# Patient Record
Sex: Female | Born: 1935 | Race: White | Hispanic: No
Health system: Southern US, Community
[De-identification: ages and names within clinical notes are randomized; demographics above are authoritative.]

## PROBLEM LIST (undated history)

## (undated) DIAGNOSIS — I471 Supraventricular tachycardia: Secondary | ICD-10-CM

---

## 2016-07-08 ENCOUNTER — Emergency Department (HOSPITAL_COMMUNITY): Payer: Medicare Other

## 2016-07-08 ENCOUNTER — Emergency Department (HOSPITAL_COMMUNITY)
Admission: EM | Admit: 2016-07-08 | Discharge: 2016-07-08 | Disposition: A | Discharge status: Home / Self Care | Payer: Medicare Other | Attending: Emergency Medicine | Admitting: Emergency Medicine

## 2016-07-08 ENCOUNTER — Encounter (HOSPITAL_COMMUNITY): Payer: Self-pay | Admitting: *Deleted

## 2016-07-08 DIAGNOSIS — R000 Tachycardia, unspecified: Secondary | ICD-10-CM | POA: Diagnosis present

## 2016-07-08 DIAGNOSIS — I471 Supraventricular tachycardia: Secondary | ICD-10-CM | POA: Diagnosis not present

## 2016-07-08 HISTORY — DX: Supraventricular tachycardia: I47.1

## 2016-07-08 LAB — CBC
HCT: 44.7 % (ref 36.0–46.0)
Hemoglobin: 15.2 g/dL — ABNORMAL HIGH (ref 12.0–15.0)
MCH: 30.1 pg (ref 26.0–34.0)
MCHC: 34 g/dL (ref 30.0–36.0)
MCV: 88.5 fL (ref 78.0–100.0)
PLATELETS: 309 10*3/uL (ref 150–400)
RBC: 5.05 MIL/uL (ref 3.87–5.11)
RDW: 14.5 % (ref 11.5–15.5)
WBC: 10.9 10*3/uL — ABNORMAL HIGH (ref 4.0–10.5)

## 2016-07-08 LAB — BASIC METABOLIC PANEL
Anion gap: 13 (ref 5–15)
BUN: 16 mg/dL (ref 6–20)
CHLORIDE: 101 mmol/L (ref 101–111)
CO2: 23 mmol/L (ref 22–32)
CREATININE: 0.79 mg/dL (ref 0.44–1.00)
Calcium: 9.5 mg/dL (ref 8.9–10.3)
GFR calc non Af Amer: >60 mL/min (ref >60)
Glucose, Bld: 209 mg/dL — ABNORMAL HIGH (ref 65–99)
Potassium: 4.1 mmol/L (ref 3.5–5.1)
Sodium: 137 mmol/L (ref 135–145)

## 2016-07-08 LAB — URINALYSIS, ROUTINE W REFLEX MICROSCOPIC
BILIRUBIN URINE: NEGATIVE
Glucose, UA: NEGATIVE mg/dL
Hgb urine dipstick: NEGATIVE
Ketones, ur: 15 mg/dL — AB
Leukocytes, UA: NEGATIVE
NITRITE: NEGATIVE
PH: 6 (ref 5.0–8.0)
Protein, ur: NEGATIVE mg/dL
SPECIFIC GRAVITY, URINE: 1.004 — AB (ref 1.005–1.030)

## 2016-07-08 LAB — I-STAT TROPONIN, ED: Troponin i, poc: 0.01 ng/mL (ref 0.00–0.08)

## 2016-07-08 MED ORDER — MORPHINE SULFATE (PF) 4 MG/ML IV SOLN
4.0000 mg | Freq: Once | INTRAVENOUS | Status: AC
  Administered 2016-07-08: 4 mg via INTRAVENOUS
  Filled 2016-07-08: qty 1

## 2016-07-08 MED ORDER — ADENOSINE 6 MG/2ML IV SOLN
INTRAVENOUS | Status: AC
  Filled 2016-07-08: qty 4

## 2016-07-08 MED ORDER — ONDANSETRON 4 MG PO TBDP: 4.0000 mg | ORAL_TABLET | Freq: Once | ORAL | Status: DC

## 2016-07-08 MED ORDER — SODIUM CHLORIDE 0.9 % IV BOLUS (SEPSIS)
500.0000 mL | Freq: Once | INTRAVENOUS | Status: AC
  Administered 2016-07-08: 500 mL via INTRAVENOUS

## 2016-07-08 MED ORDER — METOPROLOL TARTRATE 5 MG/5ML IV SOLN: 5.0000 mg | Freq: Once | INTRAVENOUS | Status: DC

## 2016-07-08 NOTE — Discharge Instructions (Signed)
Return to the ED with any concerns including recurrent SVT,, chest pain, fainting, difficulty breathing, decreased level of alertness/lethargy, or any other alarming symptoms

## 2016-07-08 NOTE — ED Notes (Signed)
Pt was instructed to blow in a syringe. Pt did this twice and hr has now decreased

## 2016-07-08 NOTE — ED Triage Notes (Signed)
Pt states that she began feeling her heart race at approx 11am today. Pt states that she has hx of same and it occurred yesterday as well. Pt states that in the past she has used 6mg  of adenosine. Pt alert and oriented.

## 2016-07-08 NOTE — ED Provider Notes (Signed)
[facility]-EMERGENCY DEPT Provider Note   CSN: [id number] Arrival date & time: 07/08/16  1157     History   Chief Complaint No chief complaint on file.   HPI Dorothy Espinoza is a 80 y.o. female.  HPI  Pt brought back emergently from triage and seen immediately upon placement in room.  Pt presenting with rapid heart rate.  She has a hx of SVT- takes toprol and has not had an episode in 4 years.  [place] from [place]- had episode of SVT yesterday and almost came to the ED but it resolved on its own.  Today she has felt rapid heart rate for the past 1.5 hours.  No chest pain, no difficulty breathing.  No fever/chills.  She states she has not been drinking as much water as usual.  She states that usually she bends over at the waist and that will resolve her SVT- however she has been having muscular back and side pain due to traveling and lifting heavy packages/luggage- so she was not able to bend over.  There are no other associated systemic symptoms, there are no other alleviating or modifying factors.   She took a long acting dose of toprol at 11am.  No dizziness or syncope.    Past Medical History:  Diagnosis Date  . SVT (supraventricular tachycardia) (HCC)     There are no active problems to display for this patient.   History reviewed. No pertinent surgical history.  OB History    No data available       Home Medications    Prior to Admission medications   Not on File    Family History No family history on file.  Social History Social History  Substance Use Topics  . Smoking status: Not on file  . Smokeless tobacco: Not on file  . Alcohol use Not on file     Allergies   Patient has no allergy information on record.   Review of Systems Review of Systems  ROS reviewed and all otherwise negative except for mentioned in HPI   Physical Exam Updated Vital Signs BP 136/74   Pulse 74   Temp 98 F (36.7 C) (Oral)   Resp 19   Wt 58.5 kg   SpO2 99%    Vitals reviewed Physical Exam Physical Examination: General appearance - alert, well appearing, and in no distress, concerned appearing Mental status - alert, oriented to person, place, and time Eyes - no conjunctival injection, no scleral icterus Chest - clear to auscultation, no wheezes, rales or rhonchi, symmetric air entry Heart - normal rate, regular rhythm, normal S1, S2, no murmurs, rubs, clicks or gallops Abdomen - soft, nontender, nondistended, no masses or organomegaly, nontender Back exam - no midline tenderness, diffuse tenderness top palpation bilaterally in lower back Neurological - alert, oriented, normal speech Extremities - peripheral pulses normal, no pedal edema, no clubbing or cyanosis Skin - normal coloration and turgor, no rashes  ED Treatments / Results  Labs (all labs ordered are listed, but only abnormal results are displayed) Labs Reviewed  BASIC METABOLIC PANEL - Abnormal; Notable for the following:       Result Value   Glucose, Bld 209 (*)    All other components within normal limits  CBC - Abnormal; Notable for the following:    WBC 10.9 (*)    Hemoglobin 15.2 (*)    All other components within normal limits  URINALYSIS, ROUTINE W REFLEX MICROSCOPIC (NOT AT [facility]) - Abnormal;  Notable for the following:    Specific Gravity, Urine 1.004 (*)    Ketones, ur 15 (*)    All other components within normal limits  I-STAT TROPOININ, ED    EKG  EKG Interpretation  Date/Time:  Friday July 08 2016 12:01:41 EST Ventricular Rate:  194 PR Interval:    QRS Duration: 86 QT Interval:  232 QTC Calculation: 416 R Axis:   83 Text Interpretation:  Supraventricular tachycardia Marked ST abnormality, possible inferolateral subendocardial injury Abnormal ECG No old tracing to compare Confirmed by [facility]  MD, MARTHA [id number]) on 07/08/2016 12:55:23 PM       Radiology Dg Chest 2 View  Result Date: 07/08/2016 CLINICAL DATA:  Tachycardia.  Chest pain EXAM: CHEST  2  VIEW COMPARISON:  None. FINDINGS: There is a minimal right pleural effusion. There is no edema or consolidation. Heart is borderline prominent with pulmonary vascularity within normal limits. No adenopathy. Bones are osteoporotic. There is anterior wedging of a mid thoracic vertebral body. IMPRESSION: Minimal right pleural effusion. No edema or consolidation. Heart borderline prominent. Bones osteoporotic with anterior wedging of a mid thoracic vertebral body, age uncertain. Electronically Signed   By: Bretta BangWilliam  Woodruff III M.D.   On: 07/08/2016 13:58    Procedures Procedures (including critical care time)  Medications Ordered in ED Medications  adenosine (ADENOCARD) 6 MG/2ML injection (not administered)  ondansetron (ZOFRAN-ODT) disintegrating tablet 4 mg (0 mg Oral Hold 07/08/16 1243)  sodium chloride 0.9 % bolus 500 mL (0 mLs Intravenous Stopped 07/08/16 1243)  morphine 4 MG/ML injection 4 mg (4 mg Intravenous Given 07/08/16 1237)     Initial Impression / Assessment and Plan / ED Course  I have reviewed the triage vital signs and the nursing notes.  Pertinent labs & imaging results that were available during my care of the patient were reviewed by me and considered in my medical decision making (see chart for details).  Clinical Course   CRITICAL CARE Performed by: Ethelda ChickLINKER,MARTHA K Total critical care time: 40 minutes Critical care time was exclusive of separately billable procedures and treating other patients. Critical care was necessary to treat or prevent imminent or life-threatening deterioration. Critical care was time spent personally by me on the following activities: development of treatment plan with patient and/or surrogate as well as nursing, discussions with consultants, evaluation of patient's response to treatment, examination of patient, obtaining history from patient or surrogate, ordering and performing treatments and interventions, ordering and review of laboratory  studies, ordering and review of radiographic studies, pulse oximetry and re-evaluation of patient's condition.  Pt initially came out of SVT after valsalva maneuver, blowing into a syringe.  Then SVT recurred to 170s.  Just as about to administer adenosine, HR decreased to 100s back in sinus rhythm.  Pt appears dehydrated and is in significant pain in sides and back- will give NS bolus and morphine to see if this will help to keep her out of SVT.  Awaiting electrolytes- pt states she has a hx of hypokalemia.    Electrolytes and other labs reassuring.  Pt has been monitored in the ED several hours and HR is in 80s/90s- remains in sinus rhythm.  Discharged with strict return precautions.  Pt agreeable with plan.  Final Clinical Impressions(s) / ED Diagnoses   Final diagnoses:  SVT (supraventricular tachycardia) (HCC)    New Prescriptions New Prescriptions   No medications on file     Jerelyn ScottMartha Linker, MD 07/08/16 1546

## 2016-07-08 NOTE — ED Notes (Signed)
Pt HR increased to 180 again. Dr. Karma GanjaLinker made aware.

## 2018-07-17 IMAGING — CR DG CHEST 2V
2 series · 2 of 2 positions shown · non-contrast
Comparison: None.

CLINICAL DATA: Tachycardia.  Chest pain

EXAM:
CHEST  2 VIEW

[chest lat]
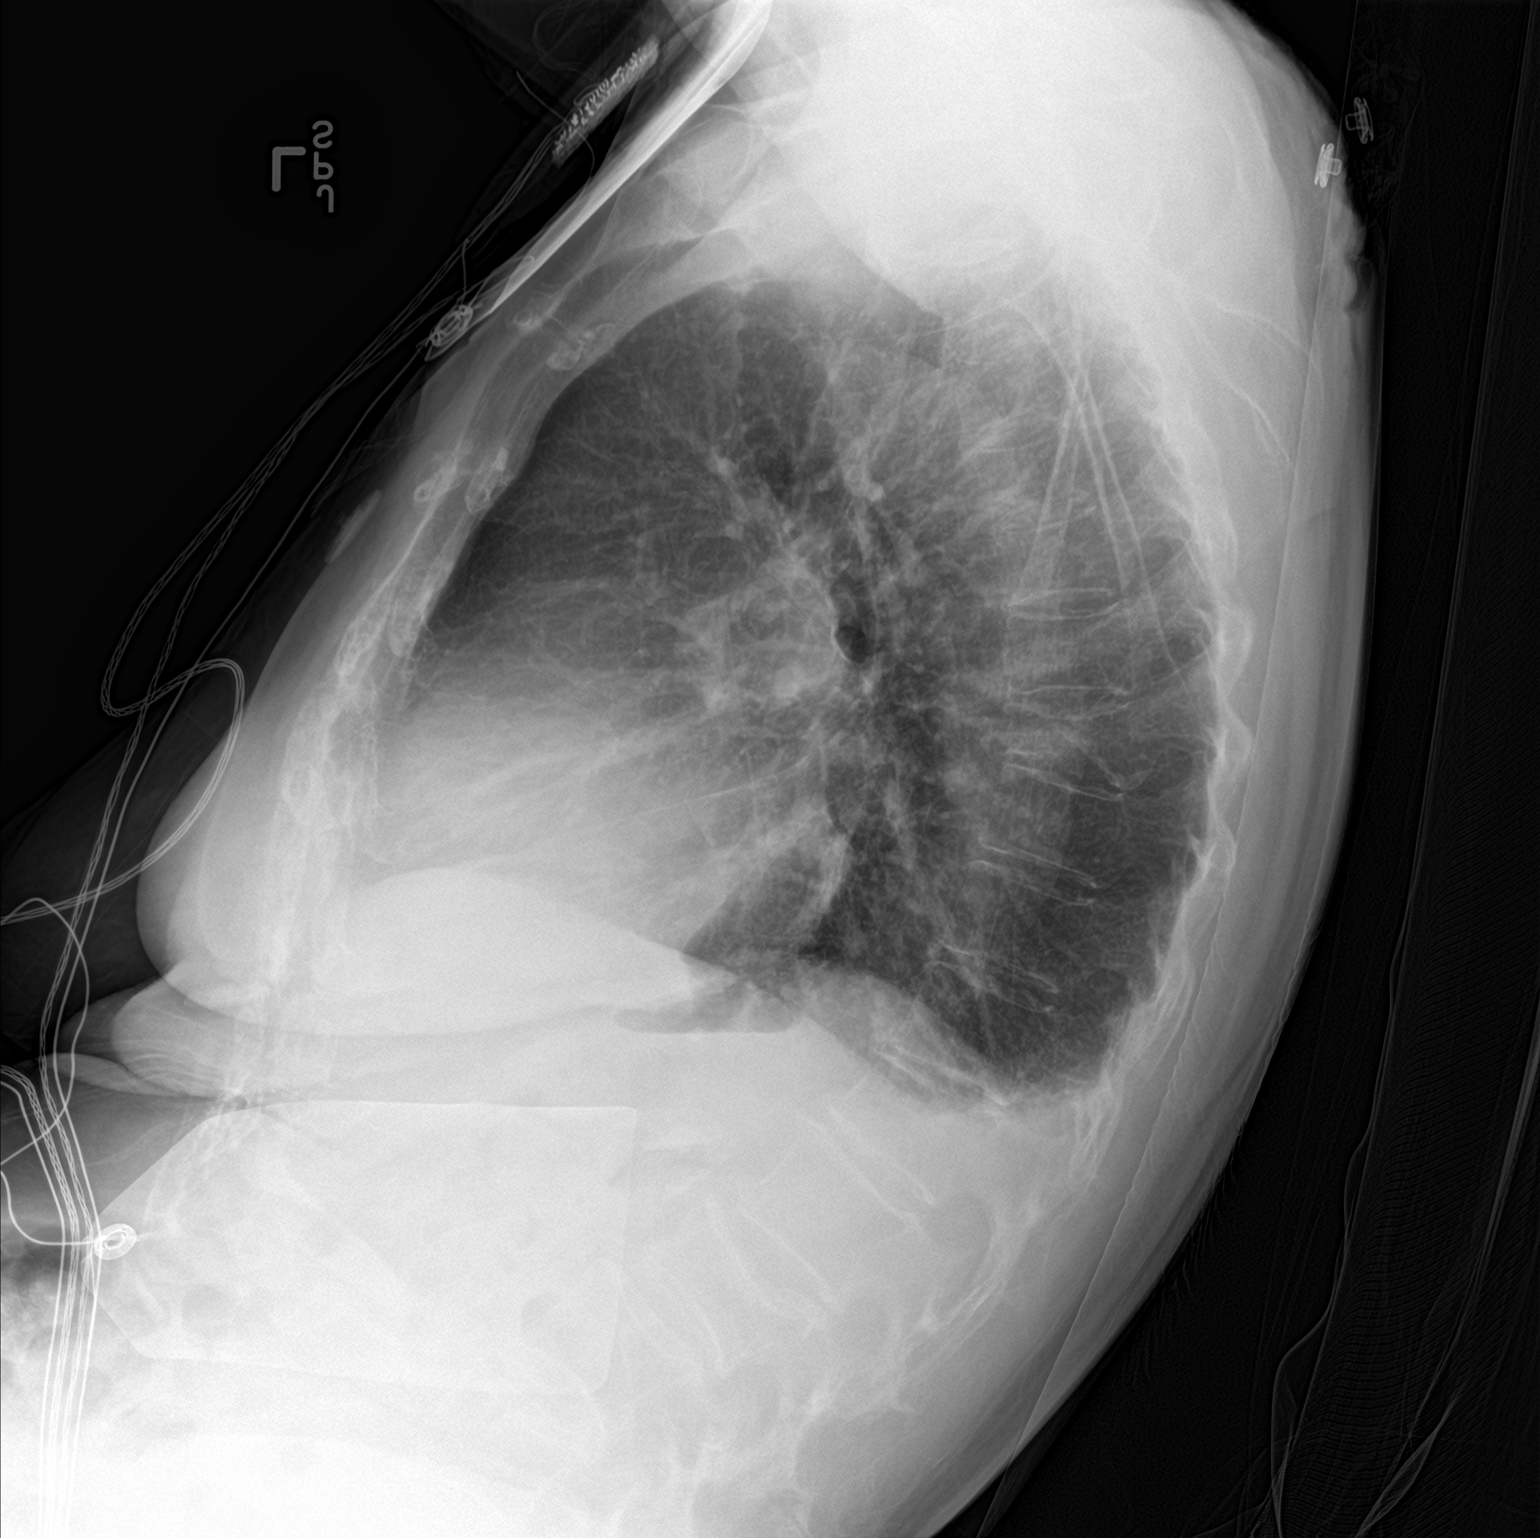

[chest ap]
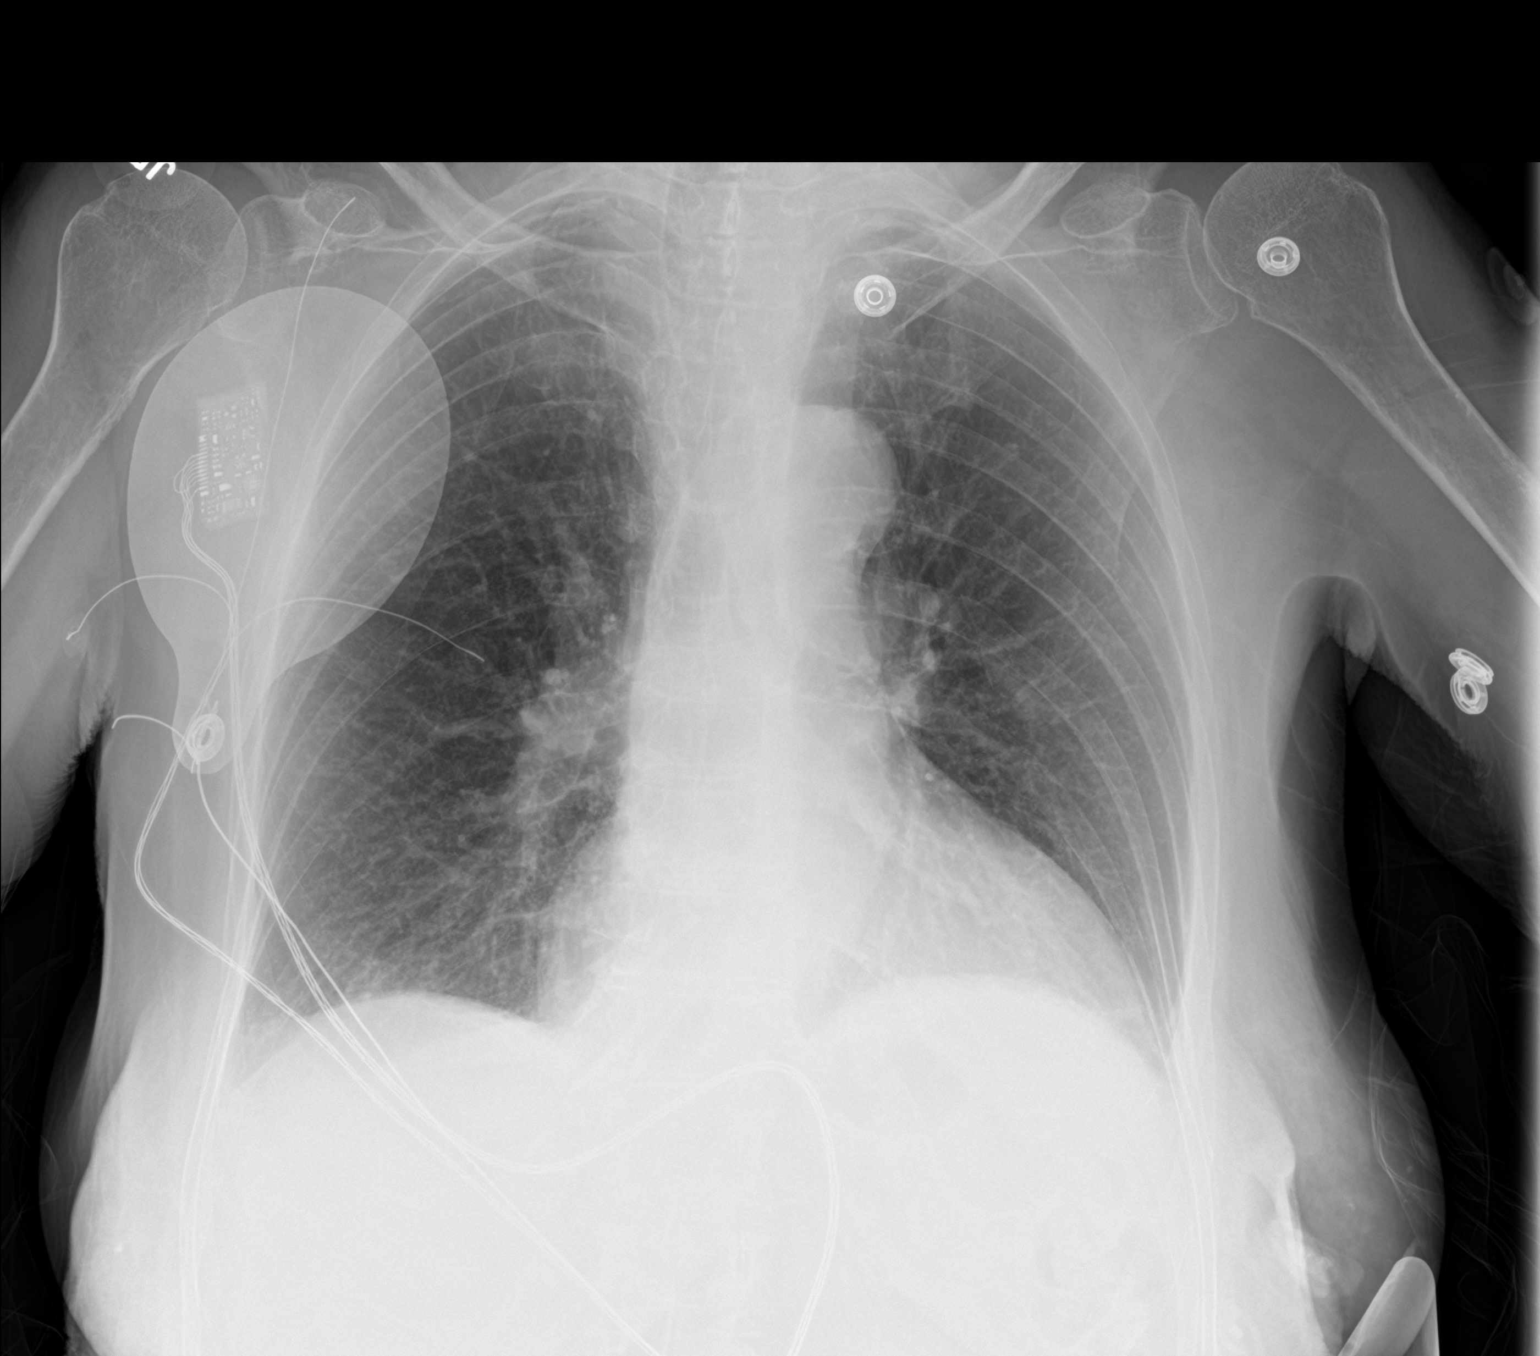

[2 of 2 positions shown; findings below may reference images not displayed]

FINDINGS: There is a minimal right pleural effusion. There is no edema or
consolidation. Heart is borderline prominent with pulmonary
vascularity within normal limits. No adenopathy. Bones are
osteoporotic. There is anterior wedging of a mid thoracic vertebral
body.
IMPRESSION: Minimal right pleural effusion. No edema or consolidation. Heart
borderline prominent. Bones osteoporotic with anterior wedging of a
mid thoracic vertebral body, age uncertain.
